# Patient Record
Sex: Male | Born: 1985 | Race: Black or African American | Hispanic: No | Marital: Single | Smoking: Former smoker
Health system: Southern US, Community
[De-identification: ages and names within clinical notes are randomized; demographics above are authoritative.]

---

## 2010-01-21 ENCOUNTER — Emergency Department (HOSPITAL_COMMUNITY): Admission: EM | Admit: 2010-01-21 | Discharge: 2010-01-21 | Payer: Self-pay | Admitting: Emergency Medicine

## 2010-01-25 ENCOUNTER — Emergency Department (HOSPITAL_COMMUNITY): Admission: EM | Admit: 2010-01-25 | Discharge: 2010-01-25 | Payer: Self-pay | Admitting: Emergency Medicine

## 2012-09-19 ENCOUNTER — Emergency Department (HOSPITAL_BASED_OUTPATIENT_CLINIC_OR_DEPARTMENT_OTHER)
Admission: EM | Admit: 2012-09-19 | Discharge: 2012-09-19 | Disposition: A | Discharge status: Home / Self Care | Payer: Self-pay | Attending: Emergency Medicine | Admitting: Emergency Medicine

## 2012-09-19 ENCOUNTER — Encounter (HOSPITAL_BASED_OUTPATIENT_CLINIC_OR_DEPARTMENT_OTHER): Payer: Self-pay | Admitting: *Deleted

## 2012-09-19 DIAGNOSIS — K044 Acute apical periodontitis of pulpal origin: Secondary | ICD-10-CM | POA: Insufficient documentation

## 2012-09-19 DIAGNOSIS — R221 Localized swelling, mass and lump, neck: Secondary | ICD-10-CM | POA: Insufficient documentation

## 2012-09-19 DIAGNOSIS — R220 Localized swelling, mass and lump, head: Secondary | ICD-10-CM | POA: Insufficient documentation

## 2012-09-19 DIAGNOSIS — R509 Fever, unspecified: Secondary | ICD-10-CM | POA: Insufficient documentation

## 2012-09-19 DIAGNOSIS — K047 Periapical abscess without sinus: Secondary | ICD-10-CM

## 2012-09-19 DIAGNOSIS — R51 Headache: Secondary | ICD-10-CM | POA: Insufficient documentation

## 2012-09-19 DIAGNOSIS — K0889 Other specified disorders of teeth and supporting structures: Secondary | ICD-10-CM

## 2012-09-19 DIAGNOSIS — K089 Disorder of teeth and supporting structures, unspecified: Secondary | ICD-10-CM | POA: Insufficient documentation

## 2012-09-19 MED ORDER — HYDROCODONE-ACETAMINOPHEN 5-325 MG PO TABS: 2.0000 | ORAL_TABLET | ORAL | Status: DC | PRN

## 2012-09-19 MED ORDER — CLINDAMYCIN HCL 150 MG PO CAPS: ORAL_CAPSULE | ORAL | Status: DC

## 2012-09-19 NOTE — ED Provider Notes (Signed)
  Medical screening examination/treatment/procedure(s) were performed by non-physician practitioner and as supervising physician I was immediately available for consultation/collaboration.    Ashana Tullo, MD 09/19/12 2359 

## 2012-09-19 NOTE — ED Notes (Signed)
Left upper tooth/face pain with HA x1 week.

## 2012-09-19 NOTE — ED Provider Notes (Signed)
History     CSN: [id number]  Arrival date & time 09/19/12  1955   First MD Initiated Contact with Patient 09/19/12 2044      Chief Complaint  Patient presents with  . Dental Pain    (Consider location/radiation/quality/duration/timing/severity/associated sxs/prior treatment) HPI Comments: Patient is a 27 y/o M, with no significant PMHx, c/o dental pain x approximately 2 weeks. Patient reported that pain is mainly to the left side of his face, described as a throbbing, pulsating discomfort that is constant and severe. Patient reported radiation of pain to left temple region, left cheek, left eye, and left post-auricular region. Patient reported that pain worsens with cold fluids, laying down, and chewing on the left side. Patient has been using Tylenol with minimal relief. Patient reported facial swelling that has reduced a great deal, has been icing face. Patient reported fever x 5 days ago, with no reoccurrence. Associated symptoms are facial swelling, headache, fever. Denied bleeding gums, lesions, drainage, dysphagia, sublingual lesions/masses, chest pain, shortness of breathe, visual distortions,  Mouth sores.   Last dental exam was 2003, reported by patient. Tonsillectomy when patient was 79-23 years old, reported by patient.   The history is provided by the patient.    History reviewed. No pertinent past medical history.  History reviewed. No pertinent past surgical history.  No family history on file.  History  Substance Use Topics  . Smoking status: Never Smoker   . Smokeless tobacco: Not on file  . Alcohol Use: Yes      Review of Systems  Constitutional: Positive for fever.  HENT: Positive for facial swelling and dental problem. Negative for sore throat.   Respiratory: Negative for shortness of breath.   Cardiovascular: Negative for chest pain.  Neurological: Positive for headaches.  10 Systems reviewed and are negative for acute change except as noted in the  HPI.   Allergies  Review of patient's allergies indicates no known allergies.  Home Medications   Current Outpatient Rx  Name  Route  Sig  Dispense  Refill  . clindamycin (CLEOCIN) 150 MG capsule      Take 2 tabs (300mg ) PO 3 (three) times a day for 7 (seven) days.   42 capsule   0   . HYDROcodone-acetaminophen (NORCO/VICODIN) 5-325 MG per tablet   Oral   Take 2 tablets by mouth every 4 (four) hours as needed for pain.   6 tablet   0     BP 150/74  Pulse 62  Temp(Src) 97.7 F (36.5 C) (Oral)  Resp 18  Wt 217 lb (98.431 kg)  SpO2 100%  Physical Exam  Nursing note and vitals reviewed. Constitutional: He is oriented to person, place, and time. He appears well-developed and well-nourished. No distress.  HENT:  Right Ear: External ear normal.  Left Ear: External ear normal.  Mouth/Throat: Oropharynx is clear and moist. No oropharyngeal exudate.  Mouth: No erythema, swelling, inflammation to buccal mucosa or gums upper and lower jaw b/l. No lesions to buccal mucosa, upper/lower jaw b/l. Pain upon palpation to upper jaw on left side - pain upon tongue depressor touching first molar of upper jaw. First molar of upper jaw appears to be cracked.  No sublingual lesions. No abscess identified.   Eyes: Conjunctivae and EOM are normal. Pupils are equal, round, and reactive to light. Right eye exhibits no discharge. Left eye exhibits no discharge.  Neck: Normal range of motion. Neck supple.  No nuchal rigidity Negative lymphadenopathy  Cardiovascular: Normal rate, regular  rhythm, normal heart sounds and intact distal pulses.  Exam reveals no gallop.   No murmur heard. Pulmonary/Chest: Effort normal and breath sounds normal. He has no wheezes. He has no rales.  Abdominal: Soft. Bowel sounds are normal. He exhibits no distension. There is no tenderness. There is no rebound.  Neurological: He is alert and oriented to person, place, and time.  Skin: Skin is warm and dry. No rash noted.  He is not diaphoretic. No erythema.    ED Course  Procedures (including critical care time)  Labs Reviewed - No data to display No results found.   1. Pain, dental   2. Tooth infection       MDM  Patient is a 27 y/o c/o dental pain x 2 weeks that has gotten progressively worse, described as a constant throbbing, pulsating pain that localizes from the left upper jaw and radiates to the left side of the patient's face. Associated symptoms are facial swelling, fever. Denied dysphagia, sublingual lesions, mouth sores, discharge.   I personally evaluated and examined patient PE: Alert and happy affect. Mouth: No erythema, swelling, inflammation to upper and lower jaw b/l. Pain upon palpation to upper first molar with tongue depressor - cracked first molar to upper jaw. No sublingual lesions, no masses, no abscesses.   Patient afebrile, normotensive, non-tachycardic, alert, happy affect. Suspected dental pain, tooth infection with possible abscess under tooth. Discharged patient with Clindamycin and pain medication. Instructed patient on how to take medications, patient understood. Referred patient to Dr. Jeanice Lim, oral surgeon/dentist, to follow-up within 24-48 hours for dental pain, possible tooth extraction. Gave patient list of referrals. Discussed with patient to use Tylenol if fever occurs. Discussed with patient to continuing icing left side of face if swelling continues. Discussed with patient is symptoms are to worsen (fever, discharge abscess or lesion formation) to report back to the ED.  Patient agreed to plan of care, understood, all questions answered.           Raymon Mutton, PA-C 09/19/12 2350

## 2012-09-19 NOTE — ED Notes (Signed)
C/O tooth pain for 7 days. States that the left side of his face is swollen and sore.  Reports that his face is not as swollen as it was.  Patient states that his left upper rear molar is chipped.  He is concerned that it may be infected.

## 2014-02-16 ENCOUNTER — Emergency Department (HOSPITAL_BASED_OUTPATIENT_CLINIC_OR_DEPARTMENT_OTHER)
Admission: EM | Admit: 2014-02-16 | Discharge: 2014-02-16 | Disposition: A | Discharge status: Home / Self Care | Payer: Self-pay | Attending: Emergency Medicine | Admitting: Emergency Medicine

## 2014-02-16 ENCOUNTER — Encounter (HOSPITAL_BASED_OUTPATIENT_CLINIC_OR_DEPARTMENT_OTHER): Payer: Self-pay | Admitting: Emergency Medicine

## 2014-02-16 DIAGNOSIS — R112 Nausea with vomiting, unspecified: Secondary | ICD-10-CM | POA: Insufficient documentation

## 2014-02-16 DIAGNOSIS — R599 Enlarged lymph nodes, unspecified: Secondary | ICD-10-CM | POA: Insufficient documentation

## 2014-02-16 DIAGNOSIS — J069 Acute upper respiratory infection, unspecified: Secondary | ICD-10-CM | POA: Insufficient documentation

## 2014-02-16 DIAGNOSIS — R6883 Chills (without fever): Secondary | ICD-10-CM | POA: Insufficient documentation

## 2014-02-16 DIAGNOSIS — Z79899 Other long term (current) drug therapy: Secondary | ICD-10-CM | POA: Insufficient documentation

## 2014-02-16 DIAGNOSIS — R0982 Postnasal drip: Secondary | ICD-10-CM | POA: Insufficient documentation

## 2014-02-16 DIAGNOSIS — IMO0002 Reserved for concepts with insufficient information to code with codable children: Secondary | ICD-10-CM | POA: Insufficient documentation

## 2014-02-16 DIAGNOSIS — R591 Generalized enlarged lymph nodes: Secondary | ICD-10-CM

## 2014-02-16 DIAGNOSIS — J029 Acute pharyngitis, unspecified: Secondary | ICD-10-CM | POA: Insufficient documentation

## 2014-02-16 LAB — CBC WITH DIFFERENTIAL/PLATELET
BASOS PCT: 0 % (ref 0–1)
Basophils Absolute: 0 10*3/uL (ref 0.0–0.1)
Eosinophils Absolute: 0.1 10*3/uL (ref 0.0–0.7)
Eosinophils Relative: 1 % (ref 0–5)
HCT: 46.3 % (ref 39.0–52.0)
HEMOGLOBIN: 15.6 g/dL (ref 13.0–17.0)
LYMPHS ABS: 2.9 10*3/uL (ref 0.7–4.0)
Lymphocytes Relative: 31 % (ref 12–46)
MCH: 29.1 pg (ref 26.0–34.0)
MCHC: 33.7 g/dL (ref 30.0–36.0)
MCV: 86.4 fL (ref 78.0–100.0)
MONOS PCT: 12 % (ref 3–12)
Monocytes Absolute: 1.1 10*3/uL — ABNORMAL HIGH (ref 0.1–1.0)
NEUTROS ABS: 5.2 10*3/uL (ref 1.7–7.7)
NEUTROS PCT: 56 % (ref 43–77)
Platelets: 303 10*3/uL (ref 150–400)
RBC: 5.36 MIL/uL (ref 4.22–5.81)
RDW: 13.5 % (ref 11.5–15.5)
WBC: 9.4 10*3/uL (ref 4.0–10.5)

## 2014-02-16 LAB — BASIC METABOLIC PANEL
Anion gap: 14 (ref 5–15)
BUN: 12 mg/dL (ref 6–23)
CHLORIDE: 100 meq/L (ref 96–112)
CO2: 26 mEq/L (ref 19–32)
Calcium: 10.3 mg/dL (ref 8.4–10.5)
Creatinine, Ser: 1 mg/dL (ref 0.50–1.35)
GFR calc non Af Amer: >90 mL/min (ref >90)
GLUCOSE: 96 mg/dL (ref 70–99)
POTASSIUM: 4.1 meq/L (ref 3.7–5.3)
Sodium: 140 mEq/L (ref 137–147)

## 2014-02-16 LAB — RAPID STREP SCREEN (MED CTR MEBANE ONLY): STREPTOCOCCUS, GROUP A SCREEN (DIRECT): NEGATIVE

## 2014-02-16 MED ORDER — SALINE SPRAY 0.65 % NA SOLN: 1.0000 | NASAL | Status: DC | PRN

## 2014-02-16 MED ORDER — FLUTICASONE PROPIONATE 50 MCG/ACT NA SUSP: 2.0000 | Freq: Every day | NASAL | Status: DC

## 2014-02-16 NOTE — ED Provider Notes (Signed)
CSN: [id number]     Arrival date & time 02/16/14  1703 History   First MD Initiated Contact with Patient 02/16/14 1908     Chief Complaint  Patient presents with  . URI     (Consider location/radiation/quality/duration/timing/severity/associated sxs/prior Treatment) HPI Comments: Patient is a 28 year old male who presents to the emergency department complaining of sore throat and right-sided neck pain x1 week. Yesterday he noticed the right side of his neck was slightly swollen. Over the past week he is becoming increasingly congested with postnasal drip. Admits to associated chills, nausea and a few episodes of emesis. He has a slight cough. Denies abdominal pain, chest pain or shortness of breath. No aggravating or alleviating factors.  Patient is a 28 y.o. male presenting with URI. The history is provided by the patient.  URI Presenting symptoms: congestion and sore throat     History reviewed. No pertinent past medical history. History reviewed. No pertinent past surgical history. No family history on file. History  Substance Use Topics  . Smoking status: Never Smoker   . Smokeless tobacco: Not on file  . Alcohol Use: Yes    Review of Systems  Constitutional: Positive for chills.  HENT: Positive for congestion, postnasal drip and sore throat.   Gastrointestinal: Positive for nausea and vomiting.  All other systems reviewed and are negative.     Allergies  Review of patient's allergies indicates no known allergies.  Home Medications   Prior to Admission medications   Medication Sig Start Date End Date Taking? Authorizing Provider  clindamycin (CLEOCIN) 150 MG capsule Take 2 tabs (300mg ) PO 3 (three) times a day for 7 (seven) days. 09/19/12   Marissa Sciacca, PA-C  fluticasone (FLONASE) 50 MCG/ACT nasal spray Place 2 sprays into both nostrils daily. 02/16/14   Trevor Mace, PA-C  HYDROcodone-acetaminophen (NORCO/VICODIN) 5-325 MG per tablet Take 2 tablets by mouth every  4 (four) hours as needed for pain. 09/19/12   Marissa Sciacca, PA-C  sodium chloride (OCEAN) 0.65 % SOLN nasal spray Place 1 spray into both nostrils as needed for congestion. 02/16/14   Trevor Mace, PA-C   BP 139/73  Pulse 83  Temp(Src) 99.3 F (37.4 C) (Oral)  Resp 18  Ht 5\' 11"  (1.803 m)  Wt 221 lb (100.245 kg)  BMI 30.84 kg/m2  SpO2 100% Physical Exam  Nursing note and vitals reviewed. Constitutional: He is oriented to person, place, and time. He appears well-developed and well-nourished. No distress.  HENT:  Head: Normocephalic and atraumatic.  Nasal mucosal edema and congestion. Postnasal drip. Post oropharyngeal erythema without edema or exudate.  Eyes: Conjunctivae and EOM are normal.  Neck: Normal range of motion. Neck supple.  Cardiovascular: Normal rate, regular rhythm and normal heart sounds.   Pulmonary/Chest: Effort normal and breath sounds normal.  Musculoskeletal: Normal range of motion. He exhibits no edema.  Lymphadenopathy:    He has cervical adenopathy.       Right cervical: Superficial cervical adenopathy present.  Neurological: He is alert and oriented to person, place, and time.  Skin: Skin is warm and dry.  Psychiatric: He has a normal mood and affect. His behavior is normal.    ED Course  Procedures (including critical care time) Labs Review Labs Reviewed  CBC WITH DIFFERENTIAL - Abnormal; Notable for the following:    Monocytes Absolute 1.1 (*)    All other components within normal limits  RAPID STREP SCREEN  CULTURE, GROUP A STREP  BASIC METABOLIC PANEL  Imaging Review No results found.   EKG Interpretation None      MDM   Final diagnoses:  URI (upper respiratory infection)  Lymphadenopathy   Patient presenting with URI signs and symptoms. He is well appearing and in no apparent distress. Temperature 99.3, vital signs stable. Labs and rapid strep obtained in triage prior to patient being seen, no acute findings. He has  significant mucosal edema and nasal congestion with postnasal drip on exam. I advised him to use Mucinex, Flonase and nasal saline. Symptomatic treatment discussed. Stable for discharge. Return precautions given. Patient states understanding of treatment care plan and is agreeable.  Trevor MaceRobyn M Albert, PA-C 02/16/14 [phone]

## 2014-02-16 NOTE — Discharge Instructions (Signed)
Rest, stay well-hydrated. Use nasal spray and nasal saline as directed. He may also use saltwater gargles. Take ibuprofen, 600 800 mg every 6-8 hours as needed.  Cervical Adenitis You have a swollen lymph gland in your neck. This commonly happens with Strep and virus infections, dental problems, insect bites, and injuries about the face, scalp, or neck. The lymph glands swell as the body fights the infection or heals the injury. Swelling and firmness typically lasts for several weeks after the infection or injury is healed. Rarely lymph glands can become swollen because of cancer or TB. Antibiotics are prescribed if there is evidence of an infection. Sometimes an infected lymph gland becomes filled with pus. This condition may require opening up the abscessed gland by draining it surgically. Most of the time infected glands return to normal within two weeks. Do not poke or squeeze the swollen lymph nodes. That may keep them from shrinking back to their normal size. If the lymph gland is still swollen after 2 weeks, further medical evaluation is needed.  SEEK IMMEDIATE MEDICAL CARE IF:  You have difficulty swallowing or breathing, increased swelling, severe pain, or a high fever.  Document Released: 06/20/2005 Document Revised: 09/12/2011 Document Reviewed: 12/10/2006 [facility] Patient Information 2015 [place], [place]. This information is not intended to replace advice given to you by your health care provider. Make sure you discuss any questions you have with your health care provider. Upper Respiratory Infection, Adult An upper respiratory infection (URI) is also known as the common cold. It is often caused by a type of germ (virus). Colds are easily spread (contagious). You can pass it to others by kissing, coughing, sneezing, or drinking out of the same glass. Usually, you get better in 1 or 2 weeks.  HOME CARE   Only take medicine as told by your doctor.  Use a warm mist humidifier or breathe in  steam from a hot shower.  Drink enough water and fluids to keep your pee (urine) clear or pale yellow.  Get plenty of rest.  Return to work when your temperature is back to normal or as told by your doctor. You may use a face mask and wash your hands to stop your cold from spreading. GET HELP RIGHT AWAY IF:   After the first few days, you feel you are getting worse.  You have questions about your medicine.  You have chills, shortness of breath, or brown or red spit (mucus).  You have yellow or brown snot (nasal discharge) or pain in the face, especially when you bend forward.  You have a fever, puffy (swollen) neck, pain when you swallow, or white spots in the back of your throat.  You have a bad headache, ear pain, sinus pain, or chest pain.  You have a high-pitched whistling sound when you breathe in and out (wheezing).  You have a lasting cough or cough up blood.  You have sore muscles or a stiff neck. MAKE SURE YOU:   Understand these instructions.  Will watch your condition.  Will get help right away if you are not doing well or get worse. Document Released: 12/07/2007 Document Revised: 09/12/2011 Document Reviewed: 09/25/2013 [facility] Patient Information 2015 [place], [place]. This information is not intended to replace advice given to you by your health care provider. Make sure you discuss any questions you have with your health care provider.

## 2014-02-16 NOTE — ED Notes (Signed)
Patient states that he is having a headache, neck pain, sore throat, decreased appetite. States that his neck was swollen and sore yesterday as well.

## 2014-02-17 NOTE — ED Provider Notes (Signed)
Medical screening examination/treatment/procedure(s) were performed by non-physician practitioner and as supervising physician I was immediately available for consultation/collaboration.   EKG Interpretation None       Leasa Kincannon, MD 02/17/14 0002 

## 2014-02-18 LAB — CULTURE, GROUP A STREP

## 2014-08-19 ENCOUNTER — Emergency Department (HOSPITAL_BASED_OUTPATIENT_CLINIC_OR_DEPARTMENT_OTHER)
Admission: EM | Admit: 2014-08-19 | Discharge: 2014-08-19 | Disposition: A | Discharge status: Home / Self Care | Payer: No Typology Code available for payment source | Attending: Emergency Medicine | Admitting: Emergency Medicine

## 2014-08-19 ENCOUNTER — Encounter (HOSPITAL_BASED_OUTPATIENT_CLINIC_OR_DEPARTMENT_OTHER): Payer: Self-pay | Admitting: *Deleted

## 2014-08-19 ENCOUNTER — Emergency Department (HOSPITAL_BASED_OUTPATIENT_CLINIC_OR_DEPARTMENT_OTHER): Payer: No Typology Code available for payment source

## 2014-08-19 DIAGNOSIS — Y9389 Activity, other specified: Secondary | ICD-10-CM | POA: Insufficient documentation

## 2014-08-19 DIAGNOSIS — Z8739 Personal history of other diseases of the musculoskeletal system and connective tissue: Secondary | ICD-10-CM | POA: Insufficient documentation

## 2014-08-19 DIAGNOSIS — Y998 Other external cause status: Secondary | ICD-10-CM | POA: Diagnosis not present

## 2014-08-19 DIAGNOSIS — Z7951 Long term (current) use of inhaled steroids: Secondary | ICD-10-CM | POA: Diagnosis not present

## 2014-08-19 DIAGNOSIS — Y92410 Unspecified street and highway as the place of occurrence of the external cause: Secondary | ICD-10-CM | POA: Insufficient documentation

## 2014-08-19 DIAGNOSIS — S299XXA Unspecified injury of thorax, initial encounter: Secondary | ICD-10-CM | POA: Diagnosis present

## 2014-08-19 DIAGNOSIS — S20212A Contusion of left front wall of thorax, initial encounter: Secondary | ICD-10-CM

## 2014-08-19 MED ORDER — IBUPROFEN 600 MG PO TABS: 600.0000 mg | ORAL_TABLET | Freq: Four times a day (QID) | ORAL | Status: DC | PRN

## 2014-08-19 MED ORDER — TRAMADOL HCL 50 MG PO TABS: 50.0000 mg | ORAL_TABLET | Freq: Four times a day (QID) | ORAL | Status: DC | PRN

## 2014-08-19 MED ORDER — IBUPROFEN 400 MG PO TABS
600.0000 mg | ORAL_TABLET | Freq: Once | ORAL | Status: AC
  Administered 2014-08-19: 600 mg via ORAL
  Filled 2014-08-19 (×2): qty 1
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
## 2016-10-21 ENCOUNTER — Emergency Department (HOSPITAL_BASED_OUTPATIENT_CLINIC_OR_DEPARTMENT_OTHER)
Admission: EM | Admit: 2016-10-21 | Discharge: 2016-10-21 | Disposition: A | Discharge status: Home / Self Care | Payer: No Typology Code available for payment source | Attending: Emergency Medicine | Admitting: Emergency Medicine

## 2016-10-21 ENCOUNTER — Emergency Department (HOSPITAL_BASED_OUTPATIENT_CLINIC_OR_DEPARTMENT_OTHER): Payer: No Typology Code available for payment source

## 2016-10-21 ENCOUNTER — Encounter (HOSPITAL_BASED_OUTPATIENT_CLINIC_OR_DEPARTMENT_OTHER): Payer: Self-pay | Admitting: *Deleted

## 2016-10-21 DIAGNOSIS — M791 Myalgia: Secondary | ICD-10-CM | POA: Insufficient documentation

## 2016-10-21 DIAGNOSIS — Y92411 Interstate highway as the place of occurrence of the external cause: Secondary | ICD-10-CM | POA: Insufficient documentation

## 2016-10-21 DIAGNOSIS — M542 Cervicalgia: Secondary | ICD-10-CM | POA: Diagnosis not present

## 2016-10-21 DIAGNOSIS — Z87891 Personal history of nicotine dependence: Secondary | ICD-10-CM | POA: Insufficient documentation

## 2016-10-21 DIAGNOSIS — R51 Headache: Secondary | ICD-10-CM | POA: Diagnosis not present

## 2016-10-21 DIAGNOSIS — S199XXA Unspecified injury of neck, initial encounter: Secondary | ICD-10-CM | POA: Diagnosis present

## 2016-10-21 DIAGNOSIS — Y9389 Activity, other specified: Secondary | ICD-10-CM | POA: Insufficient documentation

## 2016-10-21 DIAGNOSIS — Y999 Unspecified external cause status: Secondary | ICD-10-CM | POA: Insufficient documentation

## 2016-10-21 MED ORDER — IBUPROFEN 400 MG PO TABS
600.0000 mg | ORAL_TABLET | Freq: Once | ORAL | Status: AC
  Administered 2016-10-21: 600 mg via ORAL
  Filled 2016-10-21: qty 1
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
## 2016-10-21 NOTE — ED Notes (Signed)
Family at bedside. 

## 2016-10-21 NOTE — ED Notes (Signed)
[employer] at bedside speaking with pt.  

## 2016-10-21 NOTE — Discharge Instructions (Signed)
Take ibuprofen and Tylenol as needed for pain and inflammation. Return to ED for worsening pain, vision changes, increased headache, additional injury, inability to walk or numbness/weakness.

## 2017-08-01 ENCOUNTER — Emergency Department (HOSPITAL_BASED_OUTPATIENT_CLINIC_OR_DEPARTMENT_OTHER)
Admission: EM | Admit: 2017-08-01 | Discharge: 2017-08-01 | Disposition: A | Discharge status: Home / Self Care | Payer: Self-pay | Attending: Emergency Medicine | Admitting: Emergency Medicine

## 2017-08-01 ENCOUNTER — Encounter (HOSPITAL_BASED_OUTPATIENT_CLINIC_OR_DEPARTMENT_OTHER): Payer: Self-pay | Admitting: *Deleted

## 2017-08-01 ENCOUNTER — Other Ambulatory Visit: Payer: Self-pay

## 2017-08-01 DIAGNOSIS — R448 Other symptoms and signs involving general sensations and perceptions: Secondary | ICD-10-CM | POA: Insufficient documentation

## 2017-08-01 DIAGNOSIS — Z5321 Procedure and treatment not carried out due to patient leaving prior to being seen by health care provider: Secondary | ICD-10-CM | POA: Insufficient documentation

## 2017-08-01 NOTE — ED Triage Notes (Signed)
Possible wood chip in his right eye. 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

## 2017-08-01 NOTE — ED Notes (Signed)
No answer when called for re evaluation and room placement

## 2017-08-01 NOTE — ED Notes (Signed)
PAGED IN LOBBY. NO ANSWER. 

## 2019-04-30 ENCOUNTER — Emergency Department (HOSPITAL_BASED_OUTPATIENT_CLINIC_OR_DEPARTMENT_OTHER)
Admission: EM | Admit: 2019-04-30 | Discharge: 2019-04-30 | Disposition: A | Discharge status: Home / Self Care | Payer: Self-pay | Attending: Emergency Medicine | Admitting: Emergency Medicine

## 2019-04-30 ENCOUNTER — Emergency Department (HOSPITAL_BASED_OUTPATIENT_CLINIC_OR_DEPARTMENT_OTHER): Payer: Self-pay

## 2019-04-30 ENCOUNTER — Other Ambulatory Visit: Payer: Self-pay

## 2019-04-30 ENCOUNTER — Encounter (HOSPITAL_BASED_OUTPATIENT_CLINIC_OR_DEPARTMENT_OTHER): Payer: Self-pay | Admitting: Emergency Medicine

## 2019-04-30 DIAGNOSIS — Z20822 Contact with and (suspected) exposure to covid-19: Secondary | ICD-10-CM

## 2019-04-30 DIAGNOSIS — R0602 Shortness of breath: Secondary | ICD-10-CM | POA: Insufficient documentation

## 2019-04-30 DIAGNOSIS — Z87891 Personal history of nicotine dependence: Secondary | ICD-10-CM | POA: Insufficient documentation

## 2019-04-30 DIAGNOSIS — Z20828 Contact with and (suspected) exposure to other viral communicable diseases: Secondary | ICD-10-CM | POA: Insufficient documentation

## 2019-04-30 DIAGNOSIS — Z79899 Other long term (current) drug therapy: Secondary | ICD-10-CM | POA: Insufficient documentation

## 2019-04-30 DIAGNOSIS — M7918 Myalgia, other site: Secondary | ICD-10-CM | POA: Insufficient documentation

## 2019-04-30 DIAGNOSIS — R509 Fever, unspecified: Secondary | ICD-10-CM | POA: Insufficient documentation

## 2019-04-30 NOTE — ED Triage Notes (Signed)
Pt c/o cough, fever with sob and HA x 2 days. Pt also reports nausea, diarrhea and decreased appetite, denies emesis. Pt also reports diaphoresis with mild dizziness. PT reports exposure to covid. Pt denies sore throat

## 2019-04-30 NOTE — Discharge Instructions (Addendum)
Return for any new or worse symptoms.  Particularly any worsening shortness of breath.  Follow-up with your work for 1 week after clearance to return.  Work note provided.  Outpatient Covid testing performed.

## 2019-04-30 NOTE — ED Provider Notes (Signed)
[facility] EMERGENCY DEPARTMENT Provider Note   CSN: [id number] Arrival date & time: 04/30/19  1914     History   Chief Complaint Chief Complaint  Patient presents with  . Cough  . Shortness of Breath    HPI Victor Beltran is a 33 y.o. male.     Patient with recent COVID-19 exposure at work from [employer].  Patient developed symptoms on Sunday flulike illness.  To include fever up to 101 cough some diarrhea nonbloody.  Feels short of breath.  Body aches.  In addition patient with complaint of mild headache.  Reports nausea along with the diarrhea.  Decreased appetite.  Mild dizziness.  No sore throat.  No neck stiffness.  Patient does state that he is starting to feel better today.  He feels that the fever broke yesterday.     History reviewed. No pertinent past medical history.  There are no active problems to display for this patient.   History reviewed. No pertinent surgical history.      Home Medications    Prior to Admission medications   Medication Sig Start Date End Date Taking? Authorizing Provider  clindamycin (CLEOCIN) 150 MG capsule Take 2 tabs (300mg ) PO 3 (three) times a day for 7 (seven) days. 09/19/12   Sciacca, Marissa, PA-C  fluticasone (FLONASE) 50 MCG/ACT nasal spray Place 2 sprays into both nostrils daily. 02/16/14   Hess, Hessie Diener, PA-C  HYDROcodone-acetaminophen (NORCO/VICODIN) 5-325 MG per tablet Take 2 tablets by mouth every 4 (four) hours as needed for pain. 09/19/12   Sciacca, Marissa, PA-C  ibuprofen (ADVIL,MOTRIN) 600 MG tablet Take 1 tablet (600 mg total) by mouth every 6 (six) hours as needed. 08/19/14   Varney Biles, MD  sodium chloride (OCEAN) 0.65 % SOLN nasal spray Place 1 spray into both nostrils as needed for congestion. 02/16/14   Hess, Hessie Diener, PA-C  traMADol (ULTRAM) 50 MG tablet Take 1 tablet (50 mg total) by mouth every 6 (six) hours as needed. 08/19/14   Varney Biles, MD    Family History History reviewed. No  pertinent family history.  Social History Social History   Tobacco Use  . Smoking status: Former Research scientist (life sciences)  . Smokeless tobacco: Never Used  Substance Use Topics  . Alcohol use: Yes    Comment: 2 weekends/month  . Drug use: No     Allergies   Patient has no known allergies.   Review of Systems Review of Systems  Constitutional: Positive for diaphoresis and fatigue. Negative for chills and fever.  HENT: Negative for rhinorrhea and sore throat.   Eyes: Negative for visual disturbance.  Respiratory: Positive for cough and shortness of breath.   Cardiovascular: Negative for chest pain and leg swelling.  Gastrointestinal: Positive for diarrhea and nausea. Negative for abdominal pain and vomiting.  Genitourinary: Negative for dysuria.  Musculoskeletal: Positive for myalgias. Negative for back pain and neck pain.  Skin: Negative for rash.  Neurological: Positive for dizziness and headaches. Negative for light-headedness.  Hematological: Does not bruise/bleed easily.  Psychiatric/Behavioral: Negative for confusion.     Physical Exam Updated Vital Signs BP 134/87 (BP Location: Right Arm)   Pulse 95   Temp 98.3 F (36.8 C) (Oral)   Resp 20   Ht 1.803 m (5\' 11" )   Wt 104.3 kg   SpO2 100%   BMI 32.08 kg/m   Physical Exam Vitals signs and nursing note reviewed.  Constitutional:      Appearance: Normal appearance. He is well-developed.  HENT:  Head: Normocephalic and atraumatic.  Eyes:     Extraocular Movements: Extraocular movements intact.     Conjunctiva/sclera: Conjunctivae normal.     Pupils: Pupils are equal, round, and reactive to light.  Neck:     Musculoskeletal: Normal range of motion and neck supple.  Cardiovascular:     Rate and Rhythm: Normal rate and regular rhythm.     Heart sounds: No murmur.  Pulmonary:     Effort: Pulmonary effort is normal. No respiratory distress.     Breath sounds: Normal breath sounds. No wheezing.  Abdominal:      Palpations: Abdomen is soft.     Tenderness: There is no abdominal tenderness.  Musculoskeletal: Normal range of motion.        General: No swelling.  Skin:    General: Skin is warm and dry.     Capillary Refill: Capillary refill takes less than 2 seconds.  Neurological:     General: No focal deficit present.     Mental Status: He is alert and oriented to person, place, and time.      ED Treatments / Results  Labs (all labs ordered are listed, but only abnormal results are displayed) Labs Reviewed  NOVEL CORONAVIRUS, NAA (HOSP ORDER, SEND-OUT TO REF LAB; TAT 18-24 HRS)    EKG None  Radiology Dg Chest Port 1 View  Result Date: 04/30/2019 CLINICAL DATA:  33 year old male with a history of fever EXAM: PORTABLE CHEST 1 VIEW COMPARISON:  08/19/2014 FINDINGS: Cardiomediastinal silhouette unchanged in size and contour. No evidence of central vascular congestion. No pneumothorax or pleural effusion. No confluent airspace disease. IMPRESSION: Negative for acute cardiopulmonary disease Electronically Signed   By: Gilmer Mor D.O.   On: 04/30/2019 09:41    Procedures Procedures (including critical care time)  Medications Ordered in ED Medications - No data to display   Initial Impression / Assessment and Plan / ED Course  I have reviewed the triage vital signs and the nursing notes.  Pertinent labs & imaging results that were available during my care of the patient were reviewed by me and considered in my medical decision making (see chart for details).        Patient in no acute distress here.  Oxygen saturations 100% on room air afebrile.  Respiratory rate 20.  Blood pressure 134/87.  Pulse 95.  Patient just wants testing for work.  He does not want extensive blood testing.  However chest x-ray was done.  Pending formal radiology reading.  But no obvious pneumonia.  Patient will get outpatient Covid testing.  Based on his vital signs respiratory rate oxygen sats patient  stable for discharge home.  Since he symptomatic.  Symptoms starting on Sunday.  Patient will be discharged home with a work note to be out of work for a week.  And then when he can return to work will be pending on the results of today's testing and his symptoms.  Chest x-ray officially read by radiology as negative for any acute findings.  Patient stable for discharge home.  Final Clinical Impressions(s) / ED Diagnoses   Final diagnoses:  Suspected COVID-19 virus infection    ED Discharge Orders    None       Vanetta Mulders, MD 04/30/19 [phone]

## 2019-04-30 NOTE — ED Notes (Signed)
Portable Xray at bedside.

## 2019-04-30 NOTE — ED Notes (Signed)
Pt denies otc ,medications pta.

## 2019-05-01 LAB — NOVEL CORONAVIRUS, NAA (HOSP ORDER, SEND-OUT TO REF LAB; TAT 18-24 HRS): SARS-CoV-2, NAA: NOT DETECTED

## 2019-05-22 ENCOUNTER — Encounter (HOSPITAL_BASED_OUTPATIENT_CLINIC_OR_DEPARTMENT_OTHER): Payer: Self-pay | Admitting: *Deleted

## 2019-05-22 ENCOUNTER — Emergency Department (HOSPITAL_BASED_OUTPATIENT_CLINIC_OR_DEPARTMENT_OTHER): Payer: Self-pay

## 2019-05-22 ENCOUNTER — Other Ambulatory Visit: Payer: Self-pay

## 2019-05-22 ENCOUNTER — Emergency Department (HOSPITAL_BASED_OUTPATIENT_CLINIC_OR_DEPARTMENT_OTHER)
Admission: EM | Admit: 2019-05-22 | Discharge: 2019-05-22 | Disposition: A | Discharge status: Home / Self Care | Payer: Self-pay | Attending: Emergency Medicine | Admitting: Emergency Medicine

## 2019-05-22 DIAGNOSIS — R0602 Shortness of breath: Secondary | ICD-10-CM | POA: Insufficient documentation

## 2019-05-22 DIAGNOSIS — Z87891 Personal history of nicotine dependence: Secondary | ICD-10-CM | POA: Insufficient documentation

## 2019-05-22 DIAGNOSIS — R079 Chest pain, unspecified: Secondary | ICD-10-CM | POA: Insufficient documentation

## 2019-05-22 LAB — CBC
HCT: 46.3 % (ref 39.0–52.0)
Hemoglobin: 14.7 g/dL (ref 13.0–17.0)
MCH: 29.3 pg (ref 26.0–34.0)
MCHC: 31.7 g/dL (ref 30.0–36.0)
MCV: 92.4 fL (ref 80.0–100.0)
Platelets: 316 10*3/uL (ref 150–400)
RBC: 5.01 MIL/uL (ref 4.22–5.81)
RDW: 13.7 % (ref 11.5–15.5)
WBC: 12.4 10*3/uL — ABNORMAL HIGH (ref 4.0–10.5)
nRBC: 0 % (ref 0.0–0.2)

## 2019-05-22 LAB — COMPREHENSIVE METABOLIC PANEL
ALT: 43 U/L (ref 0–44)
AST: 31 U/L (ref 15–41)
Albumin: 4.7 g/dL (ref 3.5–5.0)
Alkaline Phosphatase: 56 U/L (ref 38–126)
Anion gap: 8 (ref 5–15)
BUN: 8 mg/dL (ref 6–20)
CO2: 23 mmol/L (ref 22–32)
Calcium: 9.6 mg/dL (ref 8.9–10.3)
Chloride: 104 mmol/L (ref 98–111)
Creatinine, Ser: 0.88 mg/dL (ref 0.61–1.24)
GFR calc Af Amer: >60 mL/min (ref >60)
GFR calc non Af Amer: >60 mL/min (ref >60)
Glucose, Bld: 120 mg/dL — ABNORMAL HIGH (ref 70–99)
Potassium: 3.9 mmol/L (ref 3.5–5.1)
Sodium: 135 mmol/L (ref 135–145)
Total Bilirubin: 0.7 mg/dL (ref 0.3–1.2)
Total Protein: 8.4 g/dL — ABNORMAL HIGH (ref 6.5–8.1)

## 2019-05-22 LAB — TROPONIN I (HIGH SENSITIVITY): Troponin I (High Sensitivity): <2 ng/L (ref <18)

## 2019-05-22 MED ORDER — ALUM & MAG HYDROXIDE-SIMETH 200-200-20 MG/5ML PO SUSP
30.0000 mL | Freq: Once | ORAL | Status: AC
  Administered 2019-05-22: 30 mL via ORAL
  Filled 2019-05-22: qty 30

## 2019-05-22 MED ORDER — IOHEXOL 350 MG/ML SOLN
80.0000 mL | Freq: Once | INTRAVENOUS | Status: AC | PRN
  Administered 2019-05-22: 80 mL via INTRAVENOUS

## 2019-05-22 MED ORDER — HYDROMORPHONE HCL 1 MG/ML IJ SOLN
0.5000 mg | Freq: Once | INTRAMUSCULAR | Status: AC
  Administered 2019-05-22: 15:00:00 0.5 mg via INTRAVENOUS
  Filled 2019-05-22: qty 1

## 2019-05-22 MED ORDER — HYDROMORPHONE HCL 1 MG/ML IJ SOLN
1.0000 mg | Freq: Once | INTRAMUSCULAR | Status: AC
  Administered 2019-05-22: 1 mg via INTRAVENOUS
  Filled 2019-05-22: qty 1

## 2019-05-22 MED ORDER — LIDOCAINE VISCOUS HCL 2 % MT SOLN
15.0000 mL | Freq: Once | OROMUCOSAL | Status: AC
  Administered 2019-05-22: 15 mL via ORAL
  Filled 2019-05-22: qty 15

## 2019-05-22 MED ORDER — SODIUM CHLORIDE 0.9 % IV BOLUS
1000.0000 mL | Freq: Once | INTRAVENOUS | Status: AC
  Administered 2019-05-22: 1000 mL via INTRAVENOUS

## 2019-05-22 MED ORDER — SUCRALFATE 1 G PO TABS: 1.0000 g | ORAL_TABLET | Freq: Four times a day (QID) | ORAL | 0 refills | Status: AC | PRN

## 2019-05-22 MED ORDER — FAMOTIDINE IN NACL 20-0.9 MG/50ML-% IV SOLN
20.0000 mg | Freq: Once | INTRAVENOUS | Status: AC
  Administered 2019-05-22: 14:00:00 20 mg via INTRAVENOUS
  Filled 2019-05-22: qty 50

## 2019-05-22 MED ORDER — OMEPRAZOLE 20 MG PO CPDR: 20.0000 mg | DELAYED_RELEASE_CAPSULE | Freq: Every day | ORAL | 1 refills | Status: AC

## 2019-05-22 NOTE — ED Triage Notes (Signed)
Shortness of breath since this morning

## 2019-05-22 NOTE — ED Provider Notes (Signed)
[facility]-EMERGENCY DEPT [facility] Emergency Department Provider Note MRN:  [id number]  Arrival date & time: 05/22/19     Chief Complaint   Shortness of Breath   History of Present Illness   Victor Beltran is a 33 y.o. year-old male with a history of GERD presenting to the ED with chief complaint of shortness of breath.  Sudden onset shortness of breath this morning at 7 AM, seemed to be having trouble breathing in his sleep, shaken awake by girlfriend.  Trouble taking a full breath, associated with mild chest discomfort.  Worse when laying flat or sitting back.  Improved when standing up or leaning forward.  Denies fever, no cough, no abdominal pain.  Pain is currently 10, constant.  Review of Systems  A complete 10 system review of systems was obtained and all systems are negative except as noted in the HPI and PMH.   Patient's Health History   History reviewed. No pertinent past medical history.  History reviewed. No pertinent surgical history.  History reviewed. No pertinent family history.  Social History   Socioeconomic History  . Marital status: Single    Spouse name: Not on file  . Number of children: Not on file  . Years of education: Not on file  . Highest education level: Not on file  Occupational History  . Not on file  Social Needs  . Financial resource strain: Not on file  . Food insecurity    Worry: Not on file    Inability: Not on file  . Transportation needs    Medical: Not on file    Non-medical: Not on file  Tobacco Use  . Smoking status: Former Games developer  . Smokeless tobacco: Never Used  Substance and Sexual Activity  . Alcohol use: Yes    Comment: 2 weekends/month  . Drug use: No  . Sexual activity: Yes  Lifestyle  . Physical activity    Days per week: Not on file    Minutes per session: Not on file  . Stress: Not on file  Relationships  . Social Musician on phone: Not on file    Gets together: Not on file    Attends  religious service: Not on file    Active member of club or organization: Not on file    Attends meetings of clubs or organizations: Not on file    Relationship status: Not on file  . Intimate partner violence    Fear of current or ex partner: Not on file    Emotionally abused: Not on file    Physically abused: Not on file    Forced sexual activity: Not on file  Other Topics Concern  . Not on file  Social History Narrative  . Not on file     Physical Exam  Vital Signs and Nursing Notes reviewed Vitals:   05/22/19 1305 05/22/19 1330  BP: 135/74 126/89  Pulse: 93 94  Resp: (!) 23 14  Temp: 98.6 F (37 C)   SpO2: 100% 100%    CONSTITUTIONAL: Well-appearing, mildly diaphoretic, in moderate distress due to pain NEURO:  Alert and oriented x 3, no focal deficits EYES:  eyes equal and reactive ENT/NECK:  no LAD, no JVD CARDIO: Regular rate, well-perfused, normal S1 and S2 PULM:  CTAB no wheezing or rhonchi GI/GU:  normal bowel sounds, non-distended, non-tender MSK/SPINE:  No gross deformities, no edema SKIN:  no rash, atraumatic PSYCH:  Appropriate speech and behavior  Diagnostic and Interventional Summary  EKG Interpretation  Date/Time:  Wednesday May 22 2019 12:56:56 EST Ventricular Rate:  100 PR Interval:    QRS Duration: 88 QT Interval:  336 QTC Calculation: 434 R Axis:   30 Text Interpretation: Sinus tachycardia Consider left atrial enlargement ST elev, probable normal early repol pattern Confirmed by Kennis CarinaBero, Breon Diss [phone]) on 05/22/2019 1:13:27 PM      Labs Reviewed  CBC - Abnormal; Notable for the following components:      Result Value   WBC 12.4 (*)    All other components within normal limits  COMPREHENSIVE METABOLIC PANEL - Abnormal; Notable for the following components:   Glucose, Bld 120 (*)    Total Protein 8.4 (*)    All other components within normal limits  TROPONIN I (HIGH SENSITIVITY)  TROPONIN I (HIGH SENSITIVITY)    CTA Chest for PE   Final Result    XR Chest Single View  Final Result      Medications  HYDROmorphone (DILAUDID) injection 1 mg (1 mg Intravenous Given 05/22/19 1327)  famotidine (PEPCID) IVPB 20 mg premix ( Intravenous Stopped 05/22/19 1357)  sodium chloride 0.9 % bolus 1,000 mL (0 mLs Intravenous Stopped 05/22/19 1432)  HYDROmorphone (DILAUDID) injection 0.5 mg (0.5 mg Intravenous Given 05/22/19 1434)  alum & mag hydroxide-simeth (MAALOX/MYLANTA) 200-200-20 MG/5ML suspension 30 mL (30 mLs Oral Given 05/22/19 1432)    And  lidocaine (XYLOCAINE) 2 % viscous mouth solution 15 mL (15 mLs Oral Given 05/22/19 1433)  iohexol (OMNIPAQUE) 350 MG/ML injection 80 mL (80 mLs Intravenous Contrast Given 05/22/19 1441)     Procedures  /  Critical Care Procedures  ED Course and Medical Decision Making  I have reviewed the triage vital signs and the nursing notes.  Pertinent labs & imaging results that were available during my care of the patient were reviewed by me and considered in my medical decision making (see below for details).     Concern for pulmonary embolism, pericarditis, most likely ACS given patient's age and lack of risk factors.  EKG demonstrating likely BER, similar to prior.  Work-up is pending.  Labs are reassuring, negative troponin.  CT is unremarkable, no pulmonary embolism.  Considering GERD versus esophageal spasm versus pericarditis.  Patient had considerable relief after GI cocktail, so GERD most likely.  Patient is feeling better and appropriate for discharge.  Elmer SowMichael M. Pilar PlateBero, MD [facility] [email or web address]  Final Clinical Impressions(s) / ED Diagnoses     ICD-10-CM   1. Chest pain, unspecified type  R07.9   2. SOB (shortness of breath)  R06.02 XR Chest Single View    XR Chest Single View    ED Discharge Orders         Ordered    sucralfate (CARAFATE) 1 g tablet  4 times daily PRN     05/22/19 1512    omeprazole (PRILOSEC) 20  MG capsule  Daily     05/22/19 1512           Discharge Instructions Discussed with and Provided to Patient:     Discharge Instructions     You were evaluated in the Emergency Department and after careful evaluation, we did not find any emergent condition requiring admission or further testing in the hospital.  Your exam/testing today is overall reassuring.  Your symptoms seem to be due to inflammation of the lining of the stomach related to stomach acid.  Please take the 2 medications as we discussed.  Please return  to the Emergency Department if you experience any worsening of your condition.  We encourage you to follow up with a primary care provider.  Thank you for allowing [place] to be a part of your care.       Maudie Flakes, MD 05/22/19 [phone]

## 2019-05-22 NOTE — Discharge Instructions (Addendum)
You were evaluated in the Emergency Department and after careful evaluation, we did not find any emergent condition requiring admission or further testing in the hospital.  Your exam/testing today is overall reassuring.  Your symptoms seem to be due to inflammation of the lining of the stomach related to stomach acid.  Please take the 2 medications as we discussed.  Please return to the Emergency Department if you experience any worsening of your condition.  We encourage you to follow up with a primary care provider.  Thank you for allowing [place] to be a part of your care.

## 2019-05-22 NOTE — ED Notes (Signed)
Pt on monitor 

## 2020-03-22 ENCOUNTER — Other Ambulatory Visit: Payer: Self-pay

## 2020-03-22 ENCOUNTER — Emergency Department (HOSPITAL_BASED_OUTPATIENT_CLINIC_OR_DEPARTMENT_OTHER)
Admission: EM | Admit: 2020-03-22 | Discharge: 2020-03-22 | Disposition: A | Discharge status: Home / Self Care | Payer: HRSA Program | Attending: Emergency Medicine | Admitting: Emergency Medicine

## 2020-03-22 ENCOUNTER — Encounter (HOSPITAL_BASED_OUTPATIENT_CLINIC_OR_DEPARTMENT_OTHER): Payer: Self-pay

## 2020-03-22 DIAGNOSIS — Z20822 Contact with and (suspected) exposure to covid-19: Secondary | ICD-10-CM | POA: Insufficient documentation

## 2020-03-22 DIAGNOSIS — J069 Acute upper respiratory infection, unspecified: Secondary | ICD-10-CM | POA: Diagnosis not present

## 2020-03-22 DIAGNOSIS — R509 Fever, unspecified: Secondary | ICD-10-CM | POA: Diagnosis present

## 2020-03-22 DIAGNOSIS — Z87891 Personal history of nicotine dependence: Secondary | ICD-10-CM | POA: Diagnosis not present

## 2020-03-22 LAB — SARS CORONAVIRUS 2 BY RT PCR (HOSPITAL ORDER, PERFORMED IN ~~LOC~~ HOSPITAL LAB): SARS Coronavirus 2: NEGATIVE

## 2020-03-22 MED ORDER — FLUTICASONE PROPIONATE 50 MCG/ACT NA SUSP: 2.0000 | Freq: Every day | NASAL | 0 refills | Status: AC

## 2020-03-22 MED ORDER — ALBUTEROL SULFATE HFA 108 (90 BASE) MCG/ACT IN AERS: 1.0000 | INHALATION_SPRAY | Freq: Four times a day (QID) | RESPIRATORY_TRACT | 0 refills | Status: AC | PRN

## 2020-03-22 NOTE — Discharge Instructions (Signed)
You were seen in the emergency department today with upper respiratory infection symptoms.  I have called in 2 prescriptions for symptom management at home.  Your Covid 19 test today was negative.  If you continue to have symptoms over the next 2 to 3 days would like for you to be retested prior to returning to work.  I provided a work note to this effect.  Please remain in quarantine until that time. Return with any new or suddenly worsening symptoms.

## 2020-03-22 NOTE — ED Provider Notes (Signed)
Emergency Department Provider Note   I have reviewed the triage vital signs and the nursing notes.   HISTORY  Chief Complaint Covid Exposure   HPI Victor Beltran is a 34 y.o. male presents to the ED with URI symptoms for the past 24 hours.  Patient was at a wedding 2 days ago and someone who attended later tested positive.  He states that he is developed some headache with fever and chills along with congestion type symptoms since the wedding.  He noted some mild shortness of breath this morning but that is resolved.  No vomiting or diarrhea.  He became concerned regarding COVID-19 and so presents for evaluation.  He is not experiencing any chest pain.  No radiation of symptoms or other modifying factors.   History reviewed. No pertinent past medical history.  There are no problems to display for this patient.   History reviewed. No pertinent surgical history.  Allergies Patient has no known allergies.  No family history on file.  Social History Social History   Tobacco Use  . Smoking status: Former Games developer  . Smokeless tobacco: Never Used  Substance Use Topics  . Alcohol use: Yes    Comment: 2 weekends/month  . Drug use: No    Review of Systems  Constitutional: Positive fever/chills Eyes: No visual changes. ENT: Mild sore throat and nasal congestion. Cardiovascular: Denies chest pain. Respiratory: Positive cough and SOB this AM (resolved).  Gastrointestinal: No abdominal pain.  No nausea, no vomiting.  No diarrhea.  No constipation. Genitourinary: Negative for dysuria. Musculoskeletal: Negative for back pain. Skin: Negative for rash. Neurological: Negative for focal weakness or numbness. Positive HA.   10-point ROS otherwise negative.  ____________________________________________   PHYSICAL EXAM:  VITAL SIGNS: ED Triage Vitals [03/22/20 1456]  Enc Vitals Group     BP (!) 140/97     Pulse Rate (!) 103     Resp 18     Temp 99.3 F (37.4 C)     Temp  Source Oral     SpO2 99 %     Weight 223 lb (101.2 kg)     Height 5\' 11"  (1.803 m)   Constitutional: Alert and oriented. Well appearing and in no acute distress. Eyes: Conjunctivae are normal. Head: Atraumatic. Nose: No congestion/rhinnorhea. Mouth/Throat: Mucous membranes are moist. No erythema in the oropharynx. No exudate.  Neck: No stridor.   Cardiovascular: Normal rate, regular rhythm. Good peripheral circulation. Grossly normal heart sounds.   Respiratory: Normal respiratory effort.  No retractions. Lungs CTAB. Gastrointestinal: No distention.  Musculoskeletal: No lower extremity tenderness nor edema.  Neurologic:  Normal speech and language.  Skin:  Skin is warm, dry and intact. No rash noted.  ____________________________________________   LABS (all labs ordered are listed, but only abnormal results are displayed)  Labs Reviewed  SARS CORONAVIRUS 2 BY RT PCR (HOSPITAL ORDER, PERFORMED IN [facility])   ____________________________________________   PROCEDURES  Procedure(s) performed:   Procedures  None  ____________________________________________   INITIAL IMPRESSION / ASSESSMENT AND PLAN / ED COURSE  Pertinent labs & imaging results that were available during my care of the patient were reviewed by me and considered in my medical decision making (see chart for details).   Patient presents to the emergency department with congestion and URI symptoms for 2 days after Covid exposure at a wedding.  His Covid PCR here is negative.  I have advised that he remain in quarantine and if he continues to have symptoms  over the next 2 to 3 days to be retested at that time as an outpatient.  He has normal oxygen saturation.  No increased work of breathing.  He is overall very well-appearing.    ____________________________________________  FINAL CLINICAL IMPRESSION(S) / ED DIAGNOSES  Final diagnoses:  URI, acute    NEW OUTPATIENT MEDICATIONS STARTED  DURING THIS VISIT:  New Prescriptions   ALBUTEROL (VENTOLIN HFA) 108 (90 BASE) MCG/ACT INHALER    Inhale 1-2 puffs into the lungs every 6 (six) hours as needed for wheezing or shortness of breath.   FLUTICASONE (FLONASE) 50 MCG/ACT NASAL SPRAY    Place 2 sprays into both nostrils daily for 7 days.    Note:  This document was prepared using Dragon voice recognition software and may include unintentional dictation errors.  Alona Bene, MD, [facility] Emergency Medicine    Sylis Ketchum, Arlyss Repress, MD 03/22/20 1745

## 2020-03-22 NOTE — ED Triage Notes (Signed)
Pt arrives with concern for Covid states that he was in a wedding on Friday and has since found out that someone else in the wedding was positive. P{t reports having headaches with fever and chills since.

## 2020-09-03 ENCOUNTER — Other Ambulatory Visit: Payer: Self-pay

## 2020-09-03 ENCOUNTER — Emergency Department (HOSPITAL_BASED_OUTPATIENT_CLINIC_OR_DEPARTMENT_OTHER)
Admission: EM | Admit: 2020-09-03 | Discharge: 2020-09-03 | Disposition: A | Discharge status: Home / Self Care | Payer: Self-pay | Attending: Emergency Medicine | Admitting: Emergency Medicine

## 2020-09-03 ENCOUNTER — Encounter (HOSPITAL_BASED_OUTPATIENT_CLINIC_OR_DEPARTMENT_OTHER): Payer: Self-pay | Admitting: *Deleted

## 2020-09-03 DIAGNOSIS — K0889 Other specified disorders of teeth and supporting structures: Secondary | ICD-10-CM | POA: Insufficient documentation

## 2020-09-03 DIAGNOSIS — Z87891 Personal history of nicotine dependence: Secondary | ICD-10-CM | POA: Insufficient documentation

## 2020-09-03 IMAGING — CT CT ANGIO CHEST
2 of 8 series · 19 of 36 positions shown · IV contrast (Omnipaque)
Comparison: Chest radiograph May 22, 2019

CLINICAL DATA: Shortness of breath and chest pain

EXAM:
CT ANGIOGRAPHY CHEST WITH CONTRAST
TECHNIQUE: Multidetector CT imaging of the chest was performed using the
standard protocol during bolus administration of intravenous
contrast. Multiplanar CT image reconstructions and MIPs were
obtained to evaluate the vascular anatomy.
CONTRAST:  80mL OMNIPAQUE IOHEXOL 350 MG/ML SOLN

[Series 6: pe thins · axial · 0.87mm/px · z∈[+904,+1149]mm · 18 of 275 slices shown]
[im 15/275  lung]
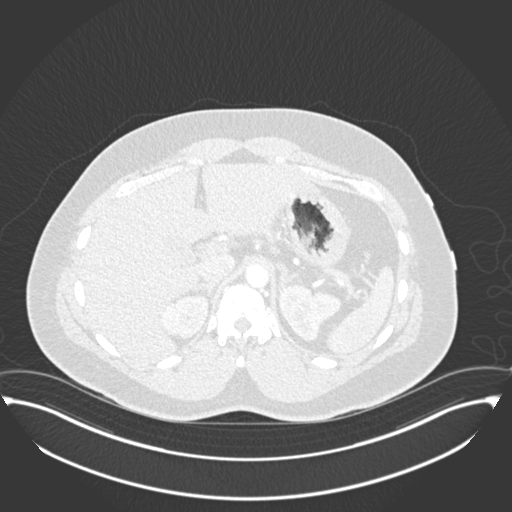
[im 29/275  mediastinal]
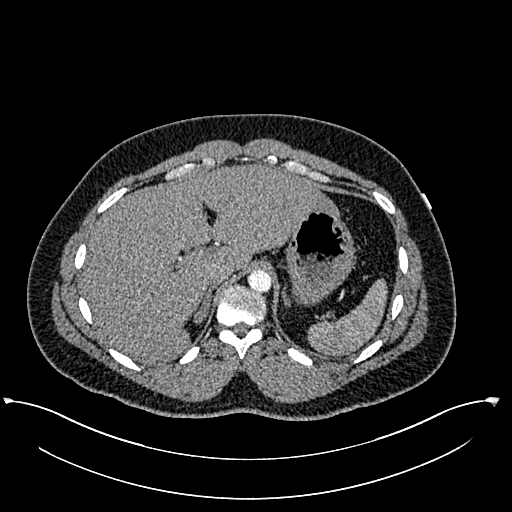
[im 44/275  lung]
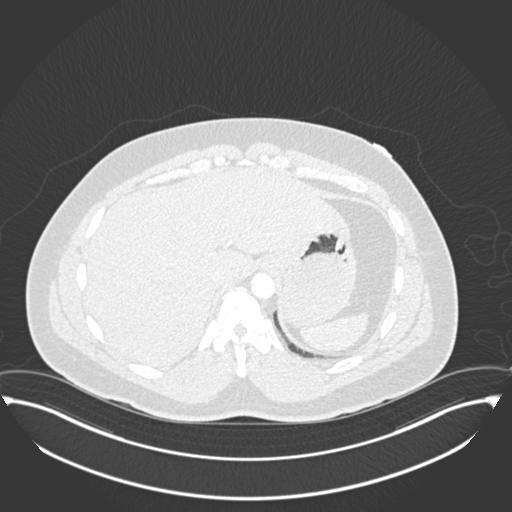
[im 58/275  mediastinal]
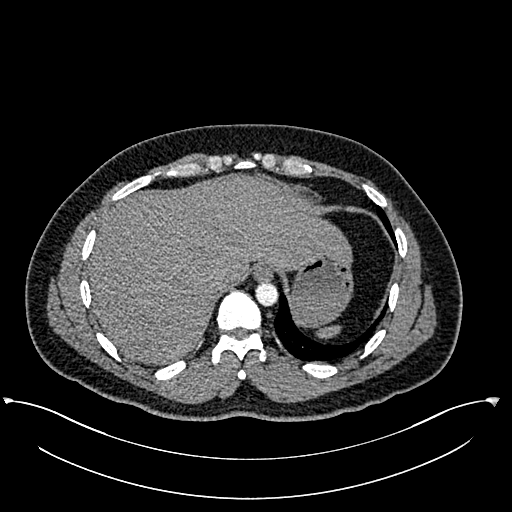
[im 73/275  lung]
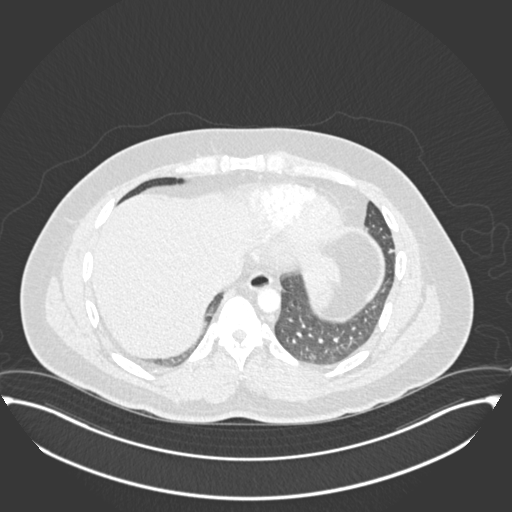
[im 87/275  mediastinal]
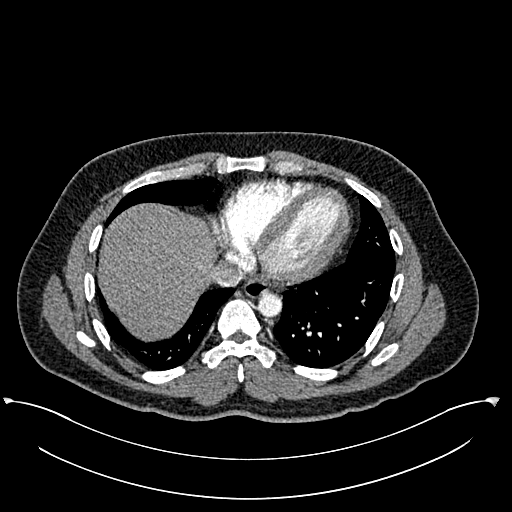
[im 101/275  lung]
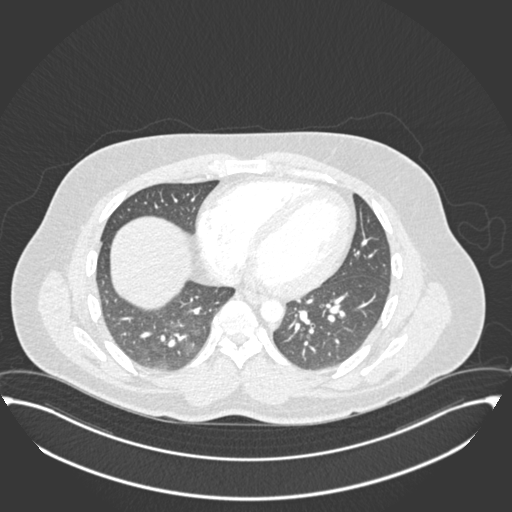
[im 116/275  mediastinal]
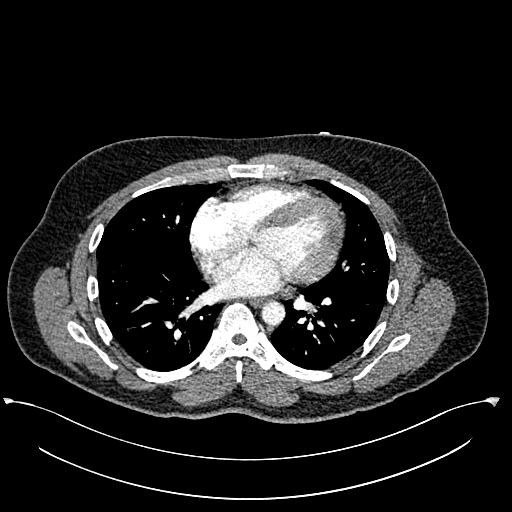
[im 130/275  lung]
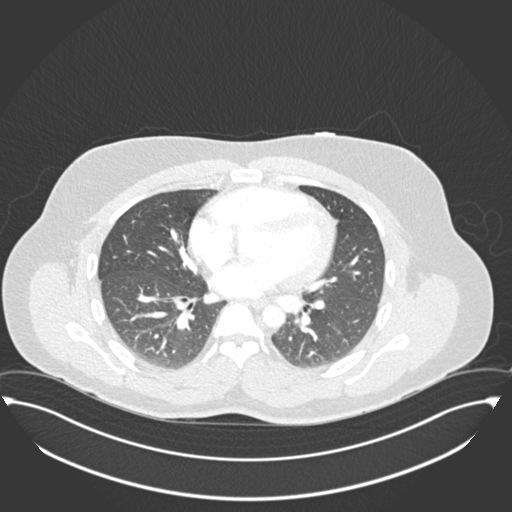
[im 145/275  mediastinal]
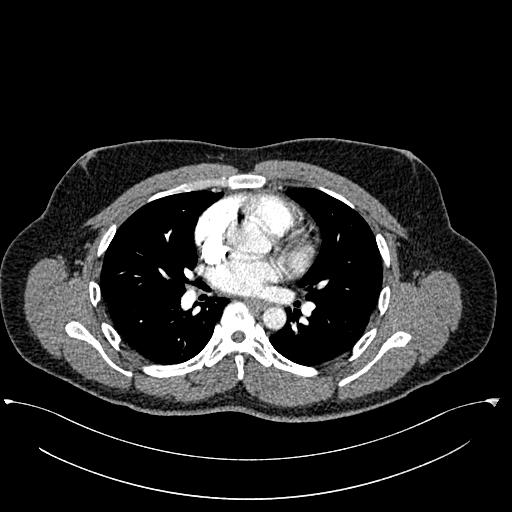
[im 159/275  lung]
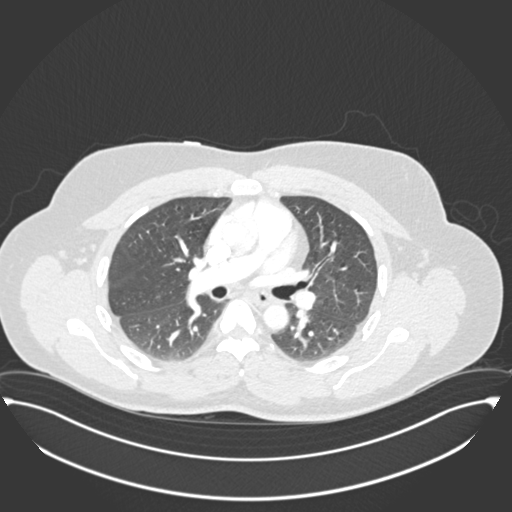
[im 174/275  mediastinal]
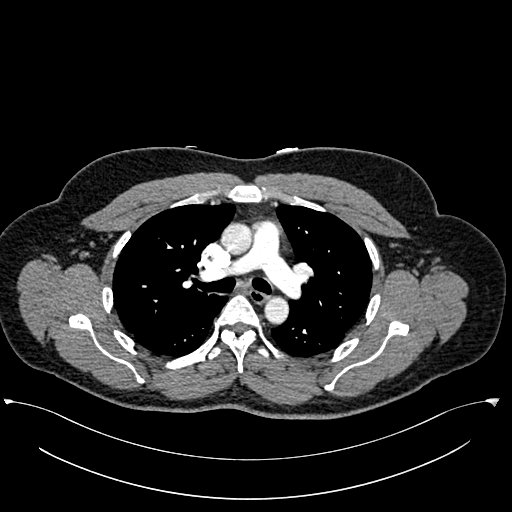
[im 188/275  lung]
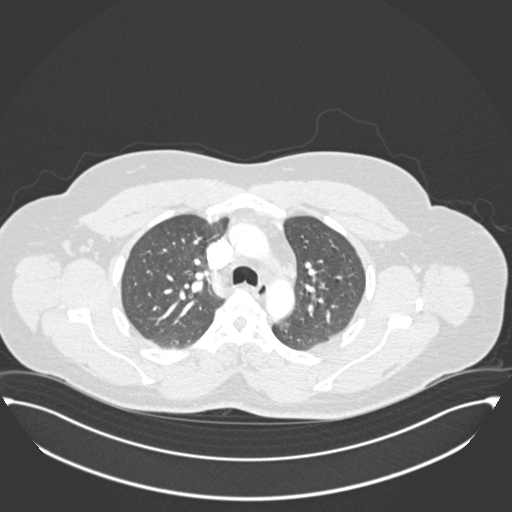
[im 202/275  mediastinal]
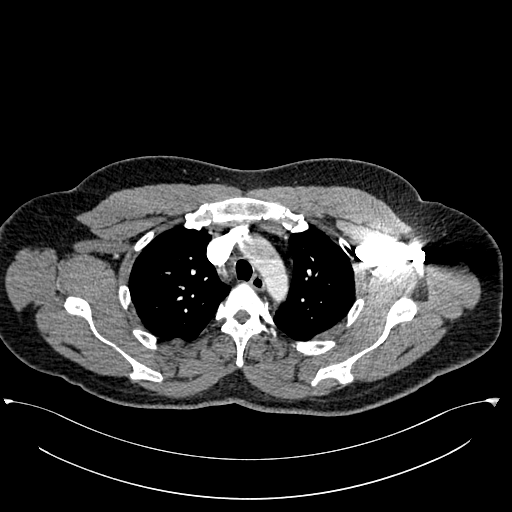
[im 217/275  lung]
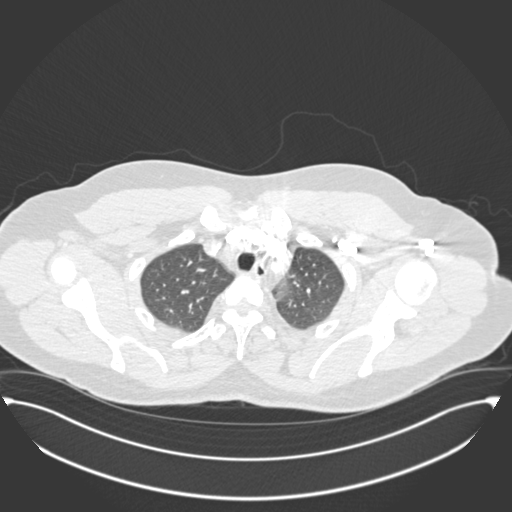
[im 231/275  mediastinal]
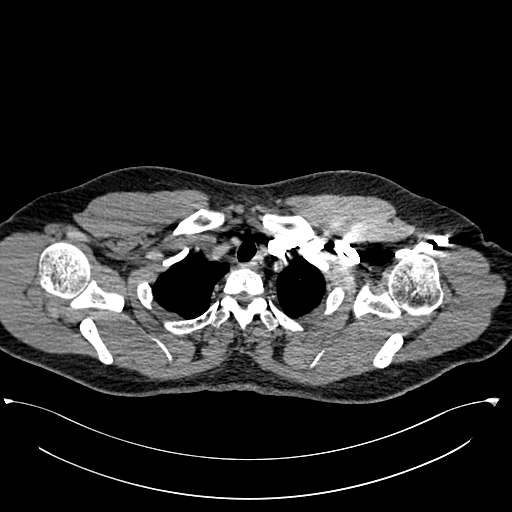
[im 246/275  lung]
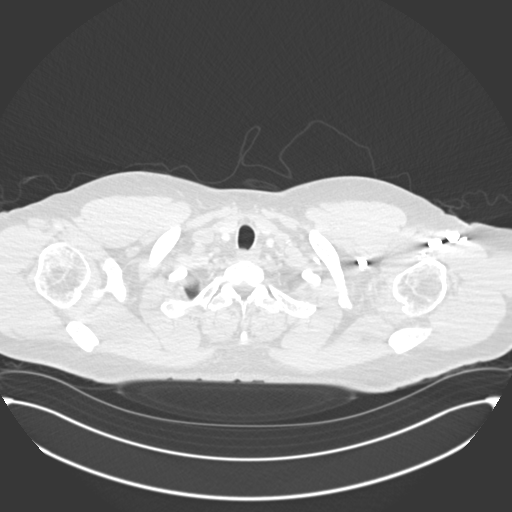
[im 260/275  mediastinal]
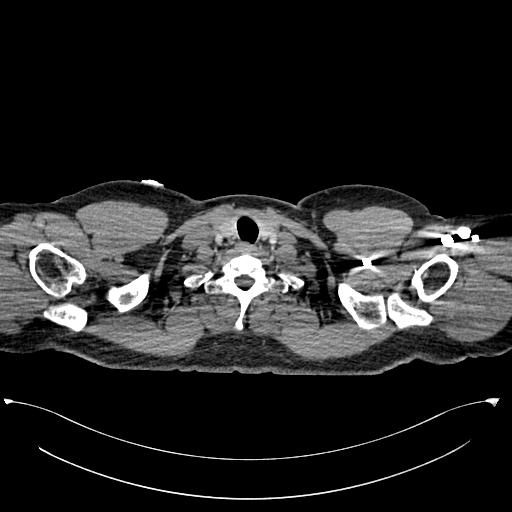

[Series 7: pe coronal mpr · coronal · 0.54mm/px · 1 of 127 slices shown]
[im 64/127  mediastinal]
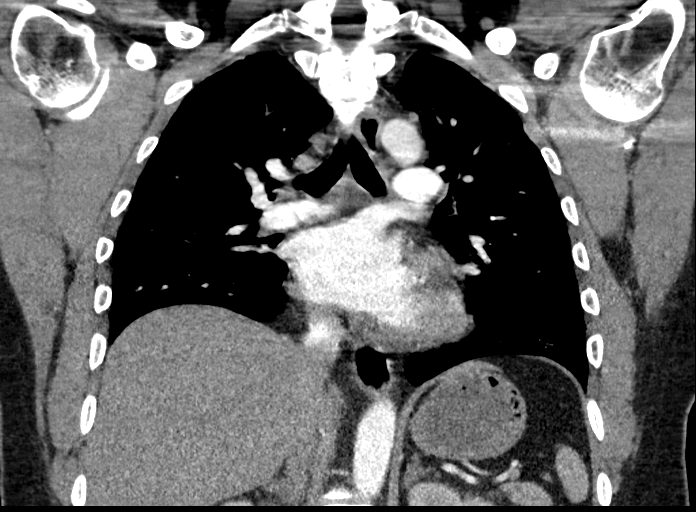

[19 of 36 positions shown; findings below may reference images not displayed]

FINDINGS: Cardiovascular: There is no demonstrable pulmonary embolus. There is
no appreciable thoracic aortic aneurysm or dissection. The
visualized great vessels appear normal. There is no evident
pericardial effusion or pericardial thickening.

Mediastinum/Nodes: Visualized thyroid appears normal. No thoracic
adenopathy appreciable. No esophageal lesions are evident.

Lungs/Pleura: There is minimal bibasilar atelectasis. No edema or
consolidation. No pleural effusions are evident.

Upper Abdomen: Visualized upper abdominal structures appear
unremarkable.

Musculoskeletal: No blastic or lytic bone lesions are evident. No
chest wall lesions appreciable.

Review of the MIP images confirms the above findings.
IMPRESSION: 1. No demonstrable pulmonary embolus. No thoracic aortic aneurysm or
dissection.

2. Slight bibasilar atelectasis. No evident edema or consolidation.
No pleural effusions.

3.  No evident adenopathy.

## 2020-09-03 MED ORDER — PENICILLIN V POTASSIUM 500 MG PO TABS: 500.0000 mg | ORAL_TABLET | Freq: Four times a day (QID) | ORAL | 0 refills | Status: AC

## 2020-09-03 MED ORDER — LIDOCAINE VISCOUS HCL 2 % MT SOLN: 15.0000 mL | OROMUCOSAL | 2 refills | Status: AC | PRN

## 2020-09-03 NOTE — ED Provider Notes (Signed)
[facility] EMERGENCY DEPARTMENT Provider Note   CSN: [id number] Arrival date & time: 09/03/20  1627     History Chief Complaint  Patient presents with  . Dental Pain    Victor Beltran is a 35 y.o. male.  HPI      Victor Beltran is a 35 y.o. male, patient with no noted past medical history, presenting to the ED with left lower dental pain beginning about 4 days ago. He has also noted some swelling in the region.  The painful tooth in question was previously fractured "a while ago, at least several months ago."  Denies fever/chills, nausea/vomiting, difficulty swallowing, difficulty breathing, or any other complaints.   History reviewed. No pertinent past medical history.  There are no problems to display for this patient.   History reviewed. No pertinent surgical history.     No family history on file.  Social History   Tobacco Use  . Smoking status: Former Games developer  . Smokeless tobacco: Never Used  Substance Use Topics  . Alcohol use: Yes    Comment: 2 weekends/month  . Drug use: No    Home Medications Prior to Admission medications   Medication Sig Start Date End Date Taking? Authorizing Provider  lidocaine (XYLOCAINE) 2 % solution Use as directed 15 mLs in the mouth or throat as needed for mouth pain. 09/03/20  Yes Eryck Negron C, PA-C  penicillin v potassium (VEETID) 500 MG tablet Take 1 tablet (500 mg total) by mouth 4 (four) times daily for 7 days. 09/03/20 09/10/20 Yes Gladyce Mcray C, PA-C  albuterol (VENTOLIN HFA) 108 (90 Base) MCG/ACT inhaler Inhale 1-2 puffs into the lungs every 6 (six) hours as needed for wheezing or shortness of breath. 03/22/20   Long, Arlyss Repress, MD  fluticasone (FLONASE) 50 MCG/ACT nasal spray Place 2 sprays into both nostrils daily for 7 days. 03/22/20 03/29/20  Long, Arlyss Repress, MD  omeprazole (PRILOSEC) 20 MG capsule Take 1 capsule (20 mg total) by mouth daily. 05/22/19   Sabas Sous, MD  sucralfate (CARAFATE) 1 g tablet Take 1  tablet (1 g total) by mouth 4 (four) times daily as needed. 05/22/19   Sabas Sous, MD    Allergies    Patient has no known allergies.  Review of Systems   Review of Systems  HENT: Positive for dental problem and facial swelling. Negative for drooling, trouble swallowing and voice change.   Gastrointestinal: Negative for nausea and vomiting.  Musculoskeletal: Negative for neck pain and neck stiffness.    Physical Exam Updated Vital Signs BP 137/81 (BP Location: Right Arm)   Pulse 92   Temp 98.6 F (37 C) (Oral)   Resp 18   Ht 5\' 11"  (1.803 m)   Wt 109.5 kg   SpO2 97%   BMI 33.65 kg/m   Physical Exam Vitals and nursing note reviewed.  Constitutional:      General: He is not in acute distress.    Appearance: He is well-developed and well-nourished. He is not diaphoretic.  HENT:     Head: Normocephalic and atraumatic.     Mouth/Throat:     Mouth: Mucous membranes are moist.     Comments: Fractured tooth estimated to be the left mandibular second premolar.  No pulp exposure noted.  Tooth seems to be stable in place.  No noted area of intraoral swelling or fluctuance.  No trismus or noted abnormal phonation.  Mouth opening to at least 3 finger widths.  Handles oral secretions  without difficulty. No sublingual swelling or tongue elevation.  No swelling or tenderness to the submental or submandibular regions.  No swelling or tenderness into the soft tissues of the neck. Eyes:     Conjunctiva/sclera: Conjunctivae normal.  Cardiovascular:     Rate and Rhythm: Normal rate and regular rhythm.  Pulmonary:     [place]: Pulmonary effort is normal.  Musculoskeletal:     Cervical back: Normal range of motion and neck supple. No rigidity or tenderness.  Lymphadenopathy:     Cervical: No cervical adenopathy.  Skin:    General: Skin is warm and dry.     Coloration: Skin is not pale.  Neurological:     Mental Status: He is alert.  Psychiatric:        Mood and Affect: Mood and  affect normal.        Behavior: Behavior normal.     ED Results / Procedures / Treatments   Labs (all labs ordered are listed, but only abnormal results are displayed) Labs Reviewed - No data to display  EKG None  Radiology No results found.  Procedures Procedures   Medications Ordered in ED Medications - No data to display  ED Course  I have reviewed the triage vital signs and the nursing notes.  Pertinent labs & imaging results that were available during my care of the patient were reviewed by me and considered in my medical decision making (see chart for details).    MDM Rules/Calculators/A&P                          Patient presents with a few days of dental pain.  Low suspicion for sepsis or Ludwig's angioedema at this point. Offered dental block with explanation, but patient declined. Dental referral and antibiotics initiated. The patient was given instructions for home care as well as return precautions. Patient voices understanding of these instructions, accepts the plan, and is comfortable with discharge.    Final Clinical Impression(s) / ED Diagnoses Final diagnoses:  Pain, dental    Rx / DC Orders ED Discharge Orders         Ordered    penicillin v potassium (VEETID) 500 MG tablet  4 times daily        09/03/20 1708    lidocaine (XYLOCAINE) 2 % solution  As needed        09/03/20 1708           Anselm Pancoast, PA-C 09/03/20 1727    Benjiman Core, MD 09/03/20 2348

## 2020-09-03 NOTE — ED Triage Notes (Signed)
Dental pain x 3 days. He has been taking Ibuprofen. Swelling to the left side of his face.

## 2020-09-03 NOTE — Discharge Instructions (Signed)
  Dental Pain You have been seen today for dental pain. You should follow up with a dentist as soon as possible. This problem will not resolve on its own without the care of a dentist.  Lidocaine liquid: Use the viscous lidocaine for mouth pain. Swish with the lidocaine and spit it out. Do not swallow it. Salt water solution: You should also swish with a homemade salt water solution, twice a day.  Make this solution by mixing 8 ounces of warm water with about half a teaspoon of salt.  The solution should be barely salty. If it is too salty, add more water. Antiinflammatory medications: Take 600 mg of ibuprofen every 6 hours or 440 mg (over the counter dose) to 500 mg (prescription dose) of naproxen every 12 hours for the next 3 days. After this time, these medications may be used as needed for pain. Take these medications with food to avoid upset stomach. Choose only one of these medications, do not take them together. Acetaminophen (generic for Tylenol): Should you continue to have additional pain while taking the ibuprofen or naproxen, you may add in acetaminophen as needed. Your daily total maximum amount of acetaminophen from all sources should be limited to 4000mg/day for persons without liver problems, or 2000mg/day for those with liver problems.  Please take all of your antibiotics until finished!   You may develop abdominal discomfort or diarrhea from the antibiotic.  You may help offset this with probiotics which you can buy or get in yogurt. Do not eat or take the probiotics until 2 hours after your antibiotic.   For prescription assistance, may try using prescription discount sites or apps, such as goodrx.com  Dental Resource Guide  [facility] [address], [address] [place], [place] [address] [phone]  [facility] [address] [place], [place] [address] [phone]  [facility] [address] [place], [place] [address] [phone]  ext. [phone]  [facility] [address] [place], [place] [address] [phone]  [facility] [address] [place], [place] [address] [phone]  [employer] [email or web address].denistry.[email or web address].edu/patientcare/[email or web address]/becomepatient  [employer] [address] [place], [place] [address] [phone]  Website for free, low-income, or sliding scale dental services in [place]: www.freedentalcare.us  To find a dentist in [place] and surrounding areas: www.ncdental.org/for-the-public/find-a-dentist  [facility] http://www.ncdental.org/meetings-events/[place]   Medicaid Dentist https://dma.ncdhhs.gov/find-[email or web address]-doctor/medicaid-dental-providers
